# Patient Record
Sex: Female | Born: 1990 | Race: White | Hispanic: No | Marital: Single | Smoking: Never smoker
Health system: Southern US, Community
[De-identification: ages and names within clinical notes are randomized; demographics above are authoritative.]

## PROBLEM LIST (undated history)

## (undated) DIAGNOSIS — M797 Fibromyalgia: Secondary | ICD-10-CM

## (undated) DIAGNOSIS — E079 Disorder of thyroid, unspecified: Secondary | ICD-10-CM

## (undated) DIAGNOSIS — F419 Anxiety disorder, unspecified: Secondary | ICD-10-CM

## (undated) DIAGNOSIS — F32A Depression, unspecified: Secondary | ICD-10-CM

## (undated) HISTORY — DX: Disorder of thyroid, unspecified: E07.9

## (undated) HISTORY — PX: TRIGGER FINGER RELEASE: SHX641

## (undated) HISTORY — DX: Depression, unspecified: F32.A

## (undated) HISTORY — DX: Fibromyalgia: M79.7

## (undated) HISTORY — DX: Anxiety disorder, unspecified: F41.9

---

## 2015-06-05 DIAGNOSIS — M778 Other enthesopathies, not elsewhere classified: Secondary | ICD-10-CM | POA: Insufficient documentation

## 2020-01-25 ENCOUNTER — Ambulatory Visit (INDEPENDENT_AMBULATORY_CARE_PROVIDER_SITE_OTHER): Payer: 59

## 2020-01-25 ENCOUNTER — Ambulatory Visit (HOSPITAL_COMMUNITY)
Admission: EM | Admit: 2020-01-25 | Discharge: 2020-01-25 | Disposition: A | Discharge status: Home / Self Care | Payer: 59

## 2020-01-25 ENCOUNTER — Encounter (HOSPITAL_COMMUNITY): Payer: Self-pay | Admitting: Emergency Medicine

## 2020-01-25 ENCOUNTER — Other Ambulatory Visit: Payer: Self-pay

## 2020-01-25 DIAGNOSIS — M79671 Pain in right foot: Secondary | ICD-10-CM

## 2020-01-25 DIAGNOSIS — M722 Plantar fascial fibromatosis: Secondary | ICD-10-CM

## 2020-01-25 MED ORDER — PREDNISONE 20 MG PO TABS: ORAL_TABLET | ORAL | 0 refills | Status: AC

## 2020-01-25 NOTE — Discharge Instructions (Addendum)
[facility] ([place]) Podiatrist in [place], [place] COVID-19 info: [email or web address] Get online care: [email or web address] Address: [address], [place], [place] [address] Phone: [phone] Appointments: [email or web address]   [facility], [place], [place] [facility] in [place], [place] Address: [address], [place], [place] [address] Phone: [phone]  

## 2020-01-25 NOTE — ED Provider Notes (Signed)
[facility]   MRN: [id number] DOB: 10-09-1990  Subjective:   Danielle Skinner is a 30 y.o. female presenting for 1 month history of recurrent right foot pain, heel pain. Uses Mobic every day as this is been a long-term problem for her.  Has also used Tylenol.  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  Denies any falls, trauma.  No current facility-administered medications for this encounter.  Current Outpatient Medications:  .  levothyroxine (SYNTHROID) 88 MCG tablet, Take 1 tablet by mouth daily., Disp: , Rfl:  .  ADDERALL XR 30 MG 24 hr capsule, Take 30 mg by mouth every morning., Disp: , Rfl:  .  amLODipine (NORVASC) 5 MG tablet, Take by mouth., Disp: , Rfl:  .  buPROPion (WELLBUTRIN) 100 MG tablet, Take 100 mg by mouth 2 (two) times daily., Disp: , Rfl:  .  DULoxetine (CYMBALTA) 20 MG capsule, Take 20 mg by mouth 2 (two) times daily., Disp: , Rfl:  .  hydrochlorothiazide (HYDRODIURIL) 12.5 MG tablet, Take 12.5 mg by mouth daily., Disp: , Rfl:  .  melatonin 1 MG TABS tablet, Take 3 mg by mouth at bedtime., Disp: , Rfl:  .  meloxicam (MOBIC) 7.5 MG tablet, Take 7.5 mg by mouth daily., Disp: , Rfl:  .  Misc Natural Products (CORTISOL MANAGER PO), Take by mouth., Disp: , Rfl:  .  predniSONE (DELTASONE) 20 MG tablet, Take 2 tablets daily with breakfast., Disp: 10 tablet, Rfl: 0 .  spironolactone (ALDACTONE) 50 MG tablet, Take 50 mg by mouth daily., Disp: , Rfl:  .  traZODone (DESYREL) 100 MG tablet, Take by mouth., Disp: , Rfl:  .  TRI-LEGEST FE 1-20/1-30/1-35 MG-MCG tablet, Take 1 tablet by mouth daily., Disp: , Rfl:  .  valACYclovir (VALTREX) 500 MG tablet, Take 500 mg by mouth daily., Disp: , Rfl:  .  valsartan (DIOVAN) 160 MG tablet, Take by mouth., Disp: , Rfl:  .  ZINC PICOLINATE PO, Take by mouth., Disp: , Rfl:    No Known Allergies  Past Medical History:  Diagnosis Date  . Anxiety   . Depression   . Fibromyalgia    . Thyroid disease      Past Surgical History:  Procedure Laterality Date  . TRIGGER FINGER RELEASE      Family History  Problem Relation Age of Onset  . Hypertension Mother   . Bipolar disorder Father        ROS   Objective:   Vitals: BP 125/77 (BP Location: Left Arm)   Pulse 89   Temp 98.5 F (36.9 C) (Oral)   LMP 01/18/2020   SpO2 96%   Physical Exam Constitutional:      General: She is not in acute distress.    Appearance: Normal appearance. She is well-developed. She is not ill-appearing, toxic-appearing or diaphoretic.  HENT:     Head: Normocephalic and atraumatic.     Nose: Nose normal.     Mouth/Throat:     Mouth: Mucous membranes are moist.     Pharynx: Oropharynx is clear.  Eyes:     General: No scleral icterus.       Right eye: No discharge.        Left eye: No discharge.     Extraocular Movements: Extraocular movements intact.     Conjunctiva/sclera: Conjunctivae normal.     Pupils: Pupils are equal, round, and reactive to light.  Cardiovascular:     Rate and Rhythm: Normal rate.  Pulmonary:     Effort: Pulmonary effort is normal.  Musculoskeletal:     Right foot: Decreased range of motion. Normal capillary refill. Tenderness (directly over plantar surface of heel worse with toes in dorsiflexion) present. No swelling, deformity or crepitus.  Skin:    General: Skin is warm and dry.  Neurological:     General: No focal deficit present.     Mental Status: She is alert and oriented to person, place, and time.  Psychiatric:        Mood and Affect: Mood normal.        Behavior: Behavior normal.        Thought Content: Thought content normal.        Judgment: Judgment normal.     DG Foot Complete Right  Result Date: 01/25/2020 CLINICAL DATA:  Right heel pain.  Plantar fasciitis EXAM: RIGHT FOOT COMPLETE - 3+ VIEW COMPARISON:  None. FINDINGS: There is no evidence of fracture or dislocation. Small plantar calcaneal spur. There is focal  osteopenia of the spur which could reflect reactive hyperemia in the setting of plantar fasciitis. There is no evidence of arthropathy or other focal bone abnormality. Soft tissues are unremarkable. IMPRESSION: 1. Small plantar calcaneal spur. Focal osteopenia of the spur could reflect reactive hyperemia in the setting of plantar fasciitis. 2. No acute fracture or significant arthropathy of the right foot. Electronically Signed   By: Duanne Guess D.O.   On: 01/25/2020 08:59    Assessment and Plan :   PDMP not reviewed this encounter.  1. Plantar fasciitis of right foot   2. Foot pain, right     Discussed general management of plantar fasciitis.  Recommended oral prednisone course that she is not responding to meloxicam and Tylenol.  Follow-up with podiatry. Counseled patient on potential for adverse effects with medications prescribed/recommended today, ER and return-to-clinic precautions discussed, patient verbalized understanding.    Wallis Bamberg, [place] 01/25/20 [phone]

## 2020-01-25 NOTE — ED Triage Notes (Signed)
Pt c/o right foot plantar fasciitis onset about a month ago. Pt states this is a chronic problem. Pt states she takes mobic 15 mg daily and 1000 mg of tylenol when working.

## 2020-02-25 ENCOUNTER — Other Ambulatory Visit: Payer: 59

## 2020-02-25 ENCOUNTER — Ambulatory Visit (INDEPENDENT_AMBULATORY_CARE_PROVIDER_SITE_OTHER): Payer: 59 | Admitting: Podiatry

## 2020-02-25 ENCOUNTER — Ambulatory Visit (INDEPENDENT_AMBULATORY_CARE_PROVIDER_SITE_OTHER): Payer: 59

## 2020-02-25 ENCOUNTER — Other Ambulatory Visit: Payer: Self-pay

## 2020-02-25 DIAGNOSIS — M722 Plantar fascial fibromatosis: Secondary | ICD-10-CM

## 2020-02-25 MED ORDER — METHYLPREDNISOLONE 4 MG PO TBPK: ORAL_TABLET | ORAL | 0 refills | Status: AC

## 2020-02-25 NOTE — Progress Notes (Signed)
   Subjective: 30 y.o. female presenting today for evaluation of pain and tenderness to the bilateral heels has been going on for approximately 1-2 years now. 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 She has tried OTC arch supports and good supportive shoes with minimal relief. Christmas Eve she went to the urgent care and was prescribed a Medrol Dosepak for this foot pain. She presents for further treatment and evaluation   Past Medical History:  Diagnosis Date  . Anxiety   . Depression   . Fibromyalgia   . Thyroid disease      Objective: Physical Exam General: The patient is alert and oriented x3 in no acute distress.  Dermatology: Skin is warm, dry and supple bilateral lower extremities. Negative for open lesions or macerations bilateral.   Vascular: Dorsalis Pedis and Posterior Tibial pulses palpable bilateral.  Capillary fill time is immediate to all digits.  Neurological: Epicritic and protective threshold intact bilateral.   Musculoskeletal: Tenderness to palpation to the plantar aspect of the bilateral heels along the plantar fascia. All other joints range of motion within normal limits bilateral. Strength 5/5 in all groups bilateral.   Radiographic exam: Normal osseous mineralization. Joint spaces preserved. No fracture/dislocation/boney destruction. No other soft tissue abnormalities or radiopaque foreign bodies. Plantar heel spurs noted on lateral view bilateral  Assessment: 1. plantar fasciitis bilateral feet  Plan of Care:  1. Patient evaluated. Xrays reviewed.   2. Patient declined injections today. Patient states that the last set of injections in her left hand she had a flare reaction for 24 hours and it was very painful. She would like to have steroidal injections for the plantar fascia when she does not have to work 3. Return to clinic in 1 week when the patient has some time off of work to rest her feet after the injections For. Prescription for  Medrol Dosepak. After completion of the Dosepak resume meloxicam 15 mg daily 5. 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 Felecia Shelling, DPM    [address] [place], [place] [address]                Office [phone]  Fax [phone]

## 2020-03-03 ENCOUNTER — Ambulatory Visit (INDEPENDENT_AMBULATORY_CARE_PROVIDER_SITE_OTHER): Payer: 59 | Admitting: Podiatry

## 2020-03-03 ENCOUNTER — Other Ambulatory Visit: Payer: Self-pay

## 2020-03-03 DIAGNOSIS — M722 Plantar fascial fibromatosis: Secondary | ICD-10-CM | POA: Diagnosis not present

## 2020-03-03 MED ORDER — BETAMETHASONE SOD PHOS & ACET 6 (3-3) MG/ML IJ SUSP
3.0000 mg | Freq: Once | INTRAMUSCULAR | Status: AC
  Administered 2020-03-03: 3 mg via INTRA_ARTICULAR

## 2020-03-03 NOTE — Progress Notes (Signed)
   Subjective: 30 y.o. female presenting today for follow-up evaluation of plantar fasciitis bilateral that has been going on for approximately 1-2 years now. 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 She has tried OTC arch supports and good supportive shoes with minimal relief. Christmas Eve she went to the urgent care and was prescribed a Medrol Dosepak for this foot pain.   Past Medical History:  Diagnosis Date  . Anxiety   . Depression   . Fibromyalgia   . Thyroid disease      Objective: Physical Exam General: The patient is alert and oriented x3 in no acute distress.  Dermatology: Skin is warm, dry and supple bilateral lower extremities. Negative for open lesions or macerations bilateral.   Vascular: Dorsalis Pedis and Posterior Tibial pulses palpable bilateral.  Capillary fill time is immediate to all digits.  Neurological: Epicritic and protective threshold intact bilateral.   Musculoskeletal: Tenderness to palpation to the plantar aspect of the bilateral heels along the plantar fascia. All other joints range of motion within normal limits bilateral. Strength 5/5 in all groups bilateral.   Radiographic exam: Normal osseous mineralization. Joint spaces preserved. No fracture/dislocation/boney destruction. No other soft tissue abnormalities or radiopaque foreign bodies. Plantar heel spurs noted on lateral view bilateral  Assessment: 1. plantar fasciitis bilateral feet. RT > LT  Plan of Care:  1. Patient evaluated. Xrays reviewed.   2.  Injection of 0.5 cc Celestone Soluspan injected into the plantar fascia right foot 3.  Continue meloxicam daily 4.  Continue [employer]ics shoes and insoles purchased at [employer] running store 5. 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 Felecia Shelling, DPM    [address] [place], [place] [address]                Office [phone]  Fax [phone]

## 2020-03-31 ENCOUNTER — Ambulatory Visit (INDEPENDENT_AMBULATORY_CARE_PROVIDER_SITE_OTHER): Payer: 59 | Admitting: Podiatry

## 2020-03-31 ENCOUNTER — Other Ambulatory Visit: Payer: Self-pay

## 2020-03-31 DIAGNOSIS — M722 Plantar fascial fibromatosis: Secondary | ICD-10-CM

## 2020-03-31 MED ORDER — BETAMETHASONE SOD PHOS & ACET 6 (3-3) MG/ML IJ SUSP
3.0000 mg | Freq: Once | INTRAMUSCULAR | Status: AC
  Administered 2020-03-31: 3 mg via INTRA_ARTICULAR

## 2020-03-31 NOTE — Progress Notes (Signed)
   Subjective: 30 y.o. female presenting today for follow-up evaluation of plantar fasciitis bilateral that has been going on for approximately 1-2 years now. 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  Overall the patient states that there has been some improvement with the injection she received last visit.  No new complaints at this time  Past Medical History:  Diagnosis Date  . Anxiety   . Depression   . Fibromyalgia   . Thyroid disease      Objective: Physical Exam General: The patient is alert and oriented x3 in no acute distress.  Dermatology: Skin is warm, dry and supple bilateral lower extremities. Negative for open lesions or macerations bilateral.   Vascular: Dorsalis Pedis and Posterior Tibial pulses palpable bilateral.  Capillary fill time is immediate to all digits.  Neurological: Epicritic and protective threshold intact bilateral.   Musculoskeletal: Tenderness to palpation to the plantar aspect of the bilateral heels along the plantar fascia. All other joints range of motion within normal limits bilateral. Strength 5/5 in all groups bilateral.   Radiographic exam: Normal osseous mineralization. Joint spaces preserved. No fracture/dislocation/boney destruction. No other soft tissue abnormalities or radiopaque foreign bodies. Plantar heel spurs noted on lateral view bilateral  Assessment: 1. plantar fasciitis bilateral feet. RT > LT  Plan of Care:  1. Patient evaluated. Xrays reviewed.   2.  Injection of 0.5 cc Celestone Soluspan injected into the plantar fascia right foot 3.  Continue meloxicam daily 4.  Continue Asics shoes and insoles purchased at [employer] running store 5. RTC 4 weeks.  We will pursue a series of steroid injections to see if this can help alleviate her symptoms prior to trying different modalities including EPAT.  EPAT was discussed today.  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 Felecia Shelling, DPM    [address] [place], [place] [address]                Office [phone]  Fax [phone]

## 2020-04-28 ENCOUNTER — Ambulatory Visit (INDEPENDENT_AMBULATORY_CARE_PROVIDER_SITE_OTHER): Payer: 59 | Admitting: Podiatry

## 2020-04-28 ENCOUNTER — Other Ambulatory Visit: Payer: Self-pay

## 2020-04-28 DIAGNOSIS — M722 Plantar fascial fibromatosis: Secondary | ICD-10-CM

## 2020-05-01 DIAGNOSIS — M722 Plantar fascial fibromatosis: Secondary | ICD-10-CM | POA: Diagnosis not present

## 2020-05-01 MED ORDER — BETAMETHASONE SOD PHOS & ACET 6 (3-3) MG/ML IJ SUSP
3.0000 mg | Freq: Once | INTRAMUSCULAR | Status: AC
  Administered 2020-05-01: 3 mg via INTRA_ARTICULAR

## 2020-05-01 NOTE — Progress Notes (Signed)
   Subjective: 30 y.o. female presenting today for follow-up evaluation of plantar fasciitis bilateral that has been going on for approximately 1-2 years now. 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  Patient TGC to improvement with the steroid injections.  She is also been wearing good supportive shoes and takes meloxicam daily.  No new complaints at this time  Past Medical History:  Diagnosis Date  . Anxiety   . Depression   . Fibromyalgia   . Thyroid disease      Objective: Physical Exam General: The patient is alert and oriented x3 in no acute distress.  Dermatology: Skin is warm, dry and supple bilateral lower extremities. Negative for open lesions or macerations bilateral.   Vascular: Dorsalis Pedis and Posterior Tibial pulses palpable bilateral.  Capillary fill time is immediate to all digits.  Neurological: Epicritic and protective threshold intact bilateral.   Musculoskeletal: Tenderness to palpation to the plantar aspect of the bilateral heels along the plantar fascia. All other joints range of motion within normal limits bilateral. Strength 5/5 in all groups bilateral.   Radiographic exam: Normal osseous mineralization. Joint spaces preserved. No fracture/dislocation/boney destruction. No other soft tissue abnormalities or radiopaque foreign bodies. Plantar heel spurs noted on lateral view bilateral  Assessment: 1. plantar fasciitis bilateral feet. RT > LT  Plan of Care:  1. Patient evaluated. Xrays reviewed.   2.  Injection of 0.5 cc Celestone Soluspan injected into the plantar fascia right foot 3.  Continue meloxicam daily 4.  Continue Asics shoes and insoles purchased at [employer] running store 5.  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 Felecia Shelling, DPM    [address] [place], [place] [address]                Office  [phone]  Fax [phone]

## 2020-06-13 ENCOUNTER — Other Ambulatory Visit: Payer: Self-pay | Admitting: *Deleted

## 2020-06-13 DIAGNOSIS — E038 Other specified hypothyroidism: Secondary | ICD-10-CM

## 2020-08-15 ENCOUNTER — Ambulatory Visit
Admission: RE | Admit: 2020-08-15 | Discharge: 2020-08-15 | Disposition: A | Discharge status: Home / Self Care | Payer: 59 | Source: Ambulatory Visit | Attending: *Deleted | Admitting: *Deleted

## 2020-08-15 ENCOUNTER — Other Ambulatory Visit: Payer: Self-pay | Admitting: *Deleted

## 2020-08-15 ENCOUNTER — Other Ambulatory Visit: Payer: Self-pay

## 2020-08-15 DIAGNOSIS — M25561 Pain in right knee: Secondary | ICD-10-CM

## 2020-08-15 DIAGNOSIS — M25551 Pain in right hip: Secondary | ICD-10-CM

## 2020-08-15 DIAGNOSIS — M25552 Pain in left hip: Secondary | ICD-10-CM

## 2020-08-15 DIAGNOSIS — E038 Other specified hypothyroidism: Secondary | ICD-10-CM

## 2021-12-23 IMAGING — DX DG FOOT COMPLETE 3+V*R*
3 series · 3 of 3 positions shown · non-contrast
Comparison: None.

CLINICAL DATA: Right heel pain.  Plantar fasciitis

EXAM:
RIGHT FOOT COMPLETE - 3+ VIEW

[foot ap]
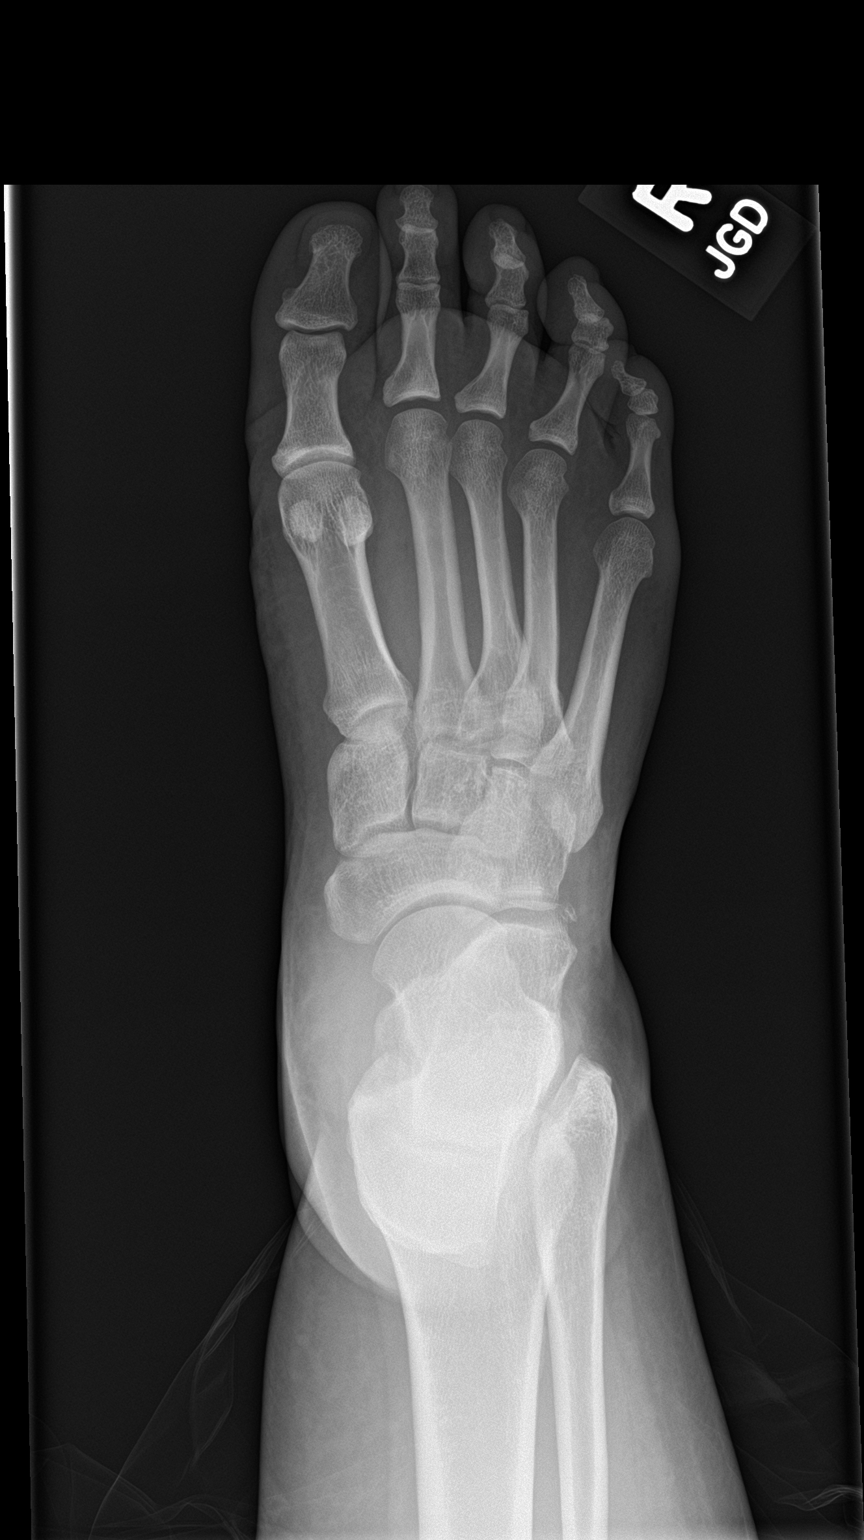

[foot obl]
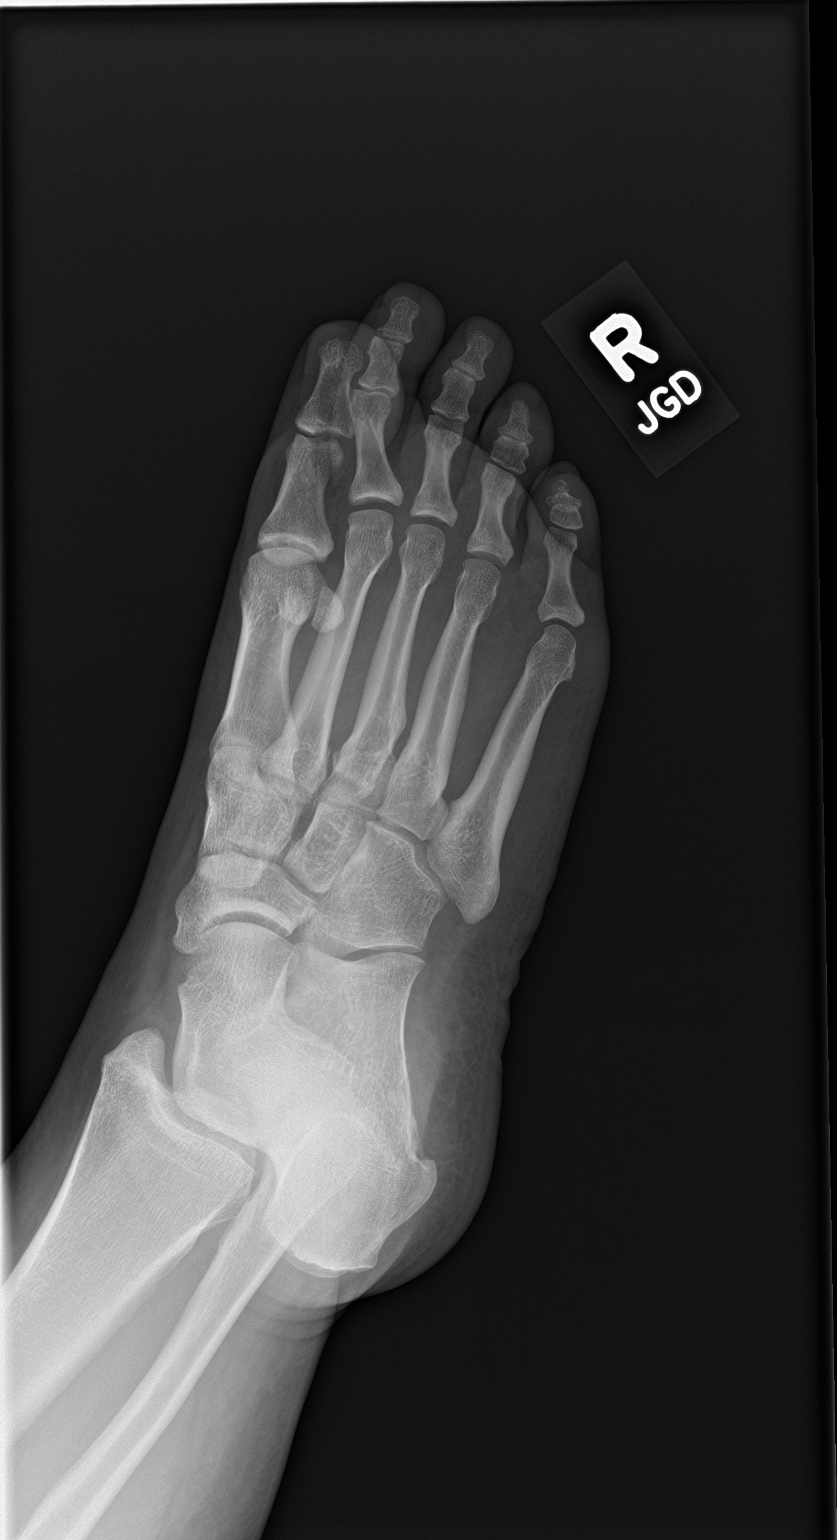

[foot lat]
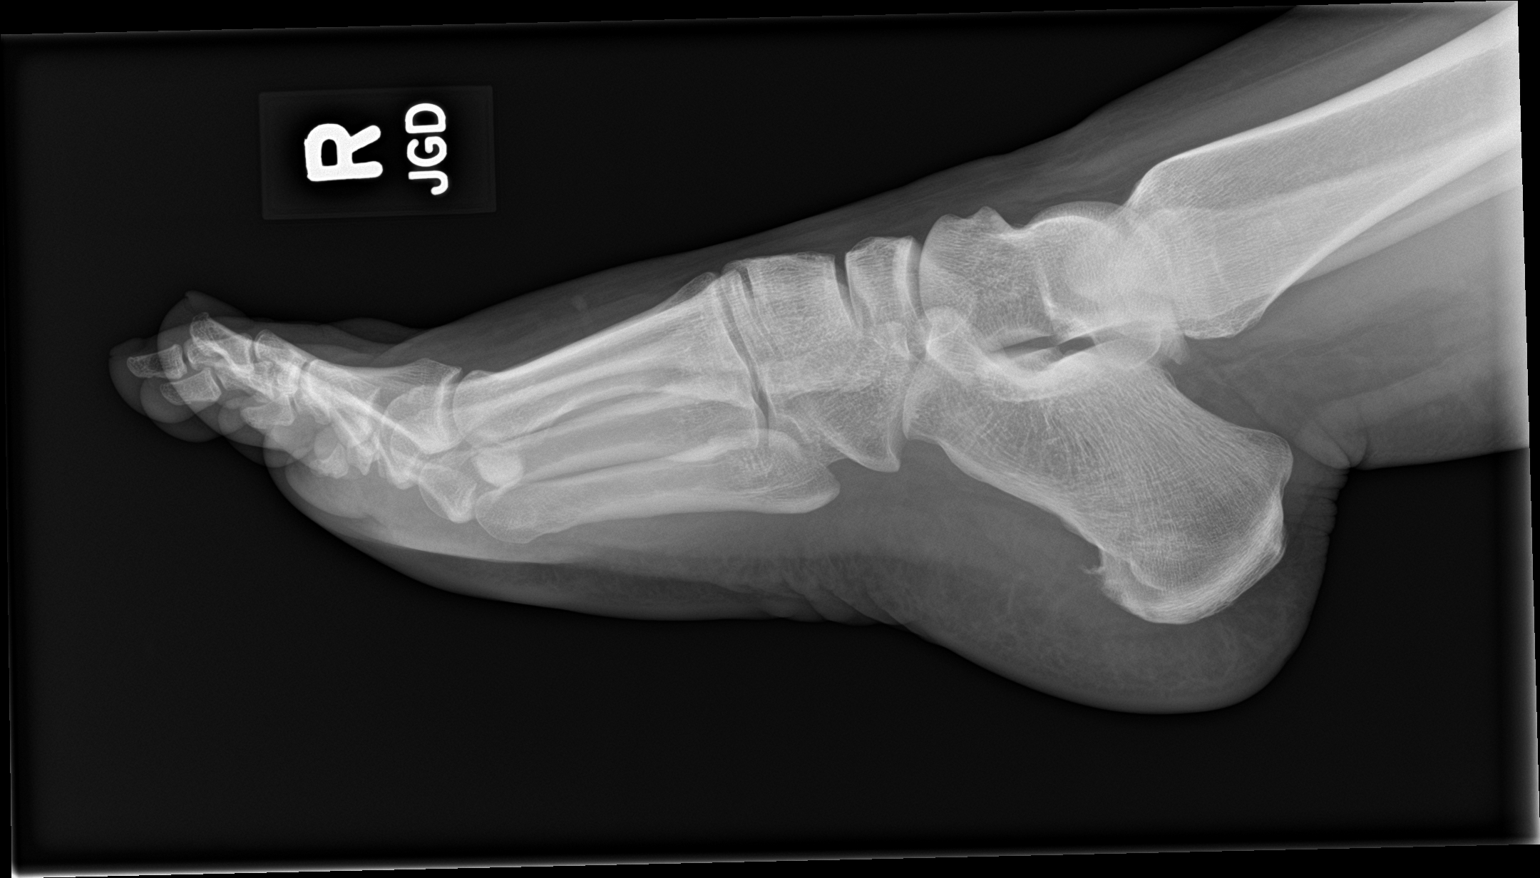

[3 of 3 positions shown; findings below may reference images not displayed]

FINDINGS: There is no evidence of fracture or dislocation. Small plantar
calcaneal spur. There is focal osteopenia of the spur which could
reflect reactive hyperemia in the setting of plantar fasciitis.
There is no evidence of arthropathy or other focal bone abnormality.
Soft tissues are unremarkable.
IMPRESSION: 1. Small plantar calcaneal spur. Focal osteopenia of the spur could
reflect reactive hyperemia in the setting of plantar fasciitis.
2. No acute fracture or significant arthropathy of the right foot.

## 2022-07-14 IMAGING — DX DG KNEE COMPLETE 4+V*R*
4 series · 4 of 4 positions shown · non-contrast
Comparison: None.

CLINICAL DATA: Chronic right knee pain

EXAM:
RIGHT KNEE - COMPLETE 4+ VIEW

[dg knee complete 4 views right (1 of 4)]
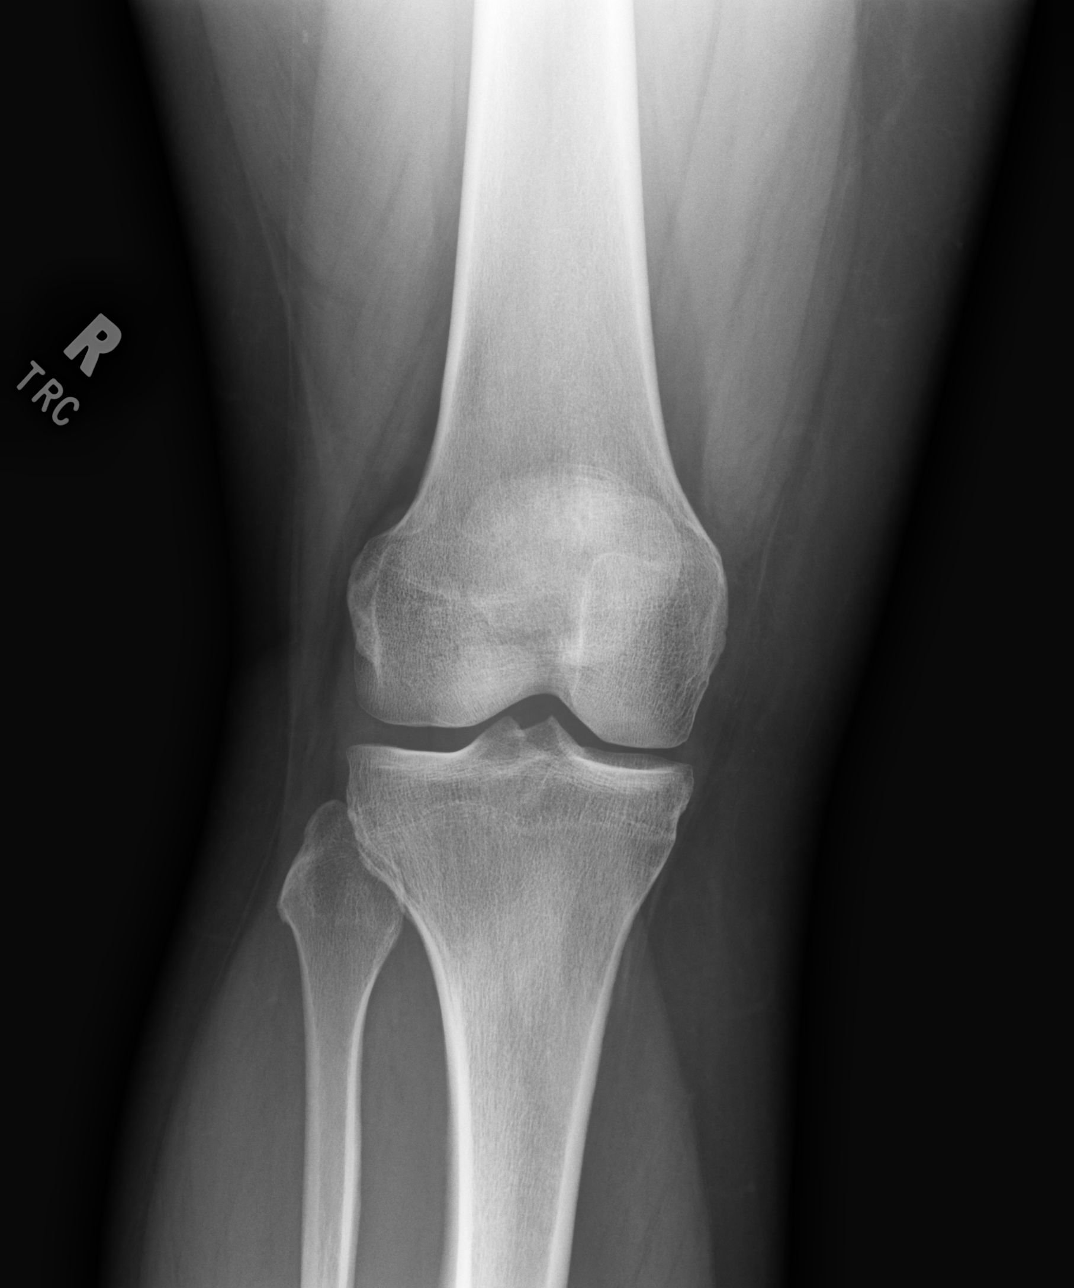

[dg knee complete 4 views right (2 of 4)]
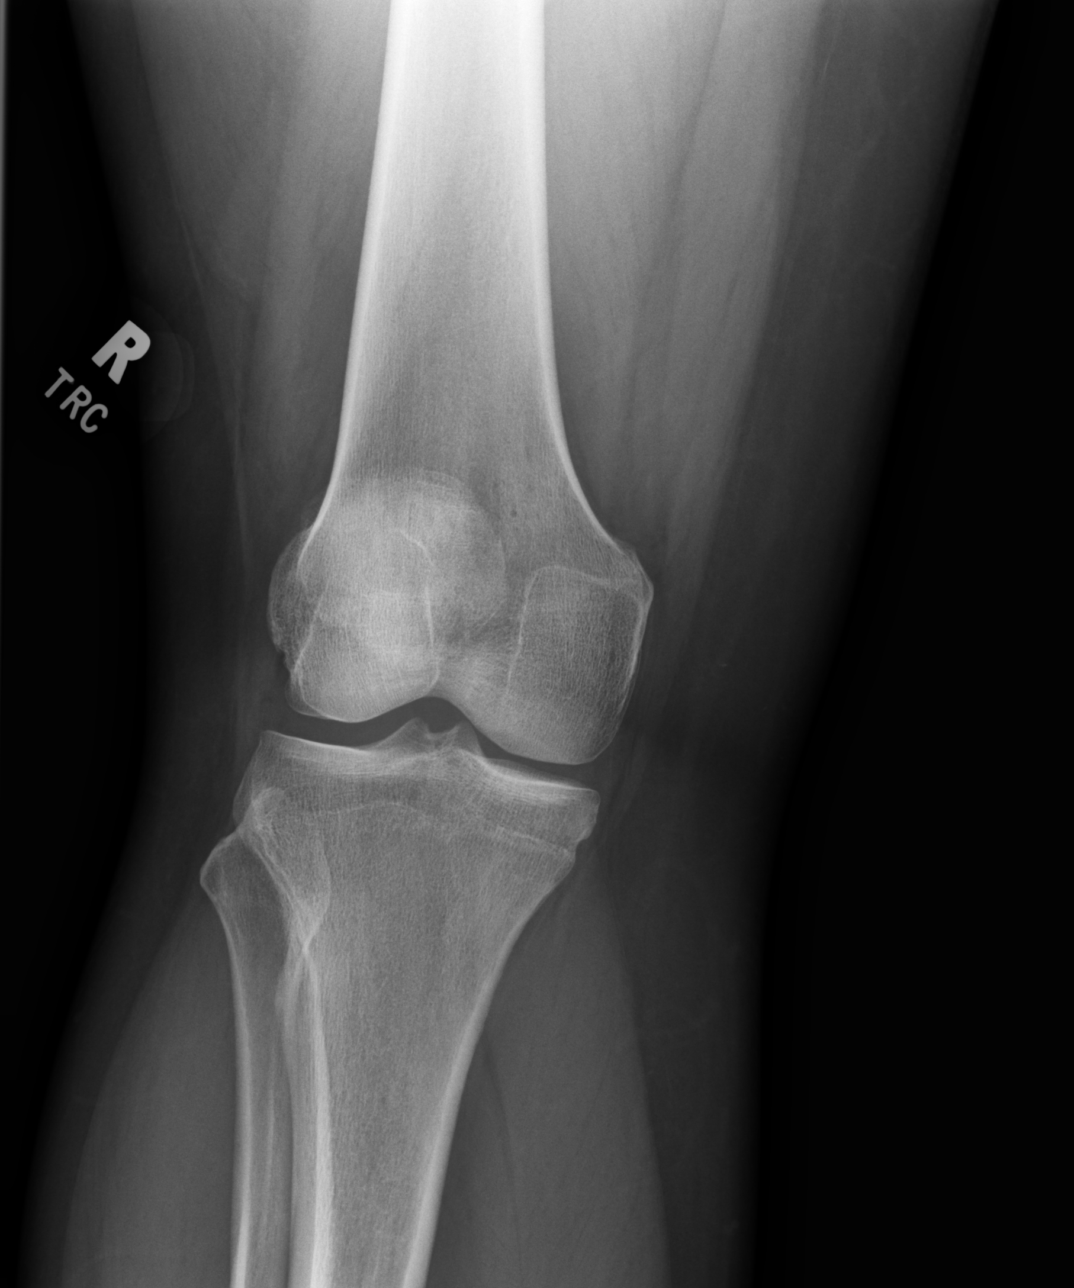

[dg knee complete 4 views right (3 of 4)]
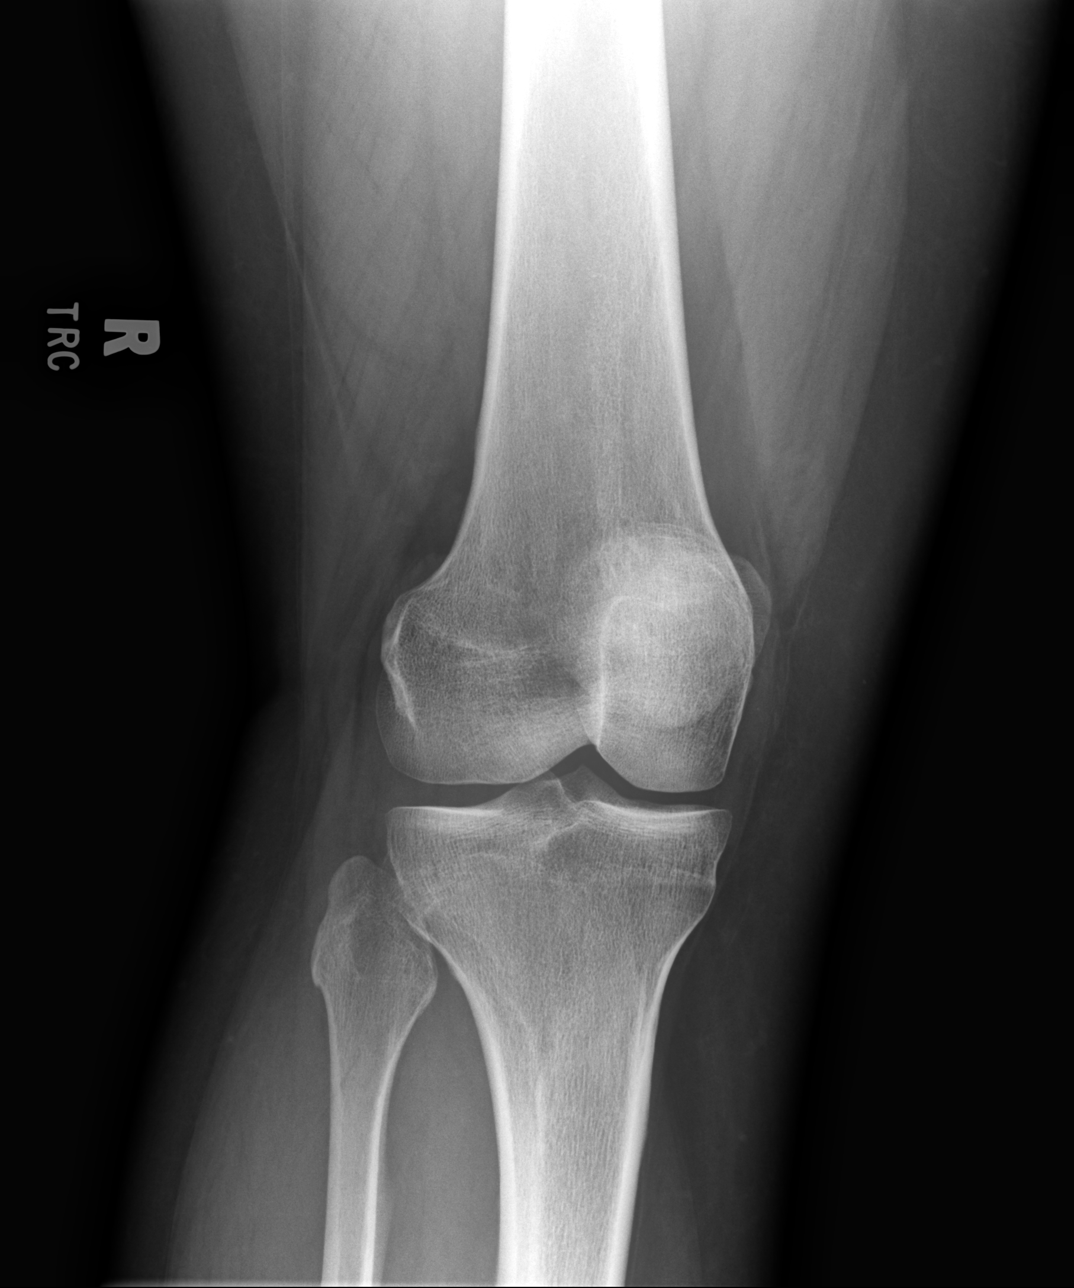

[dg knee complete 4 views right (4 of 4)]
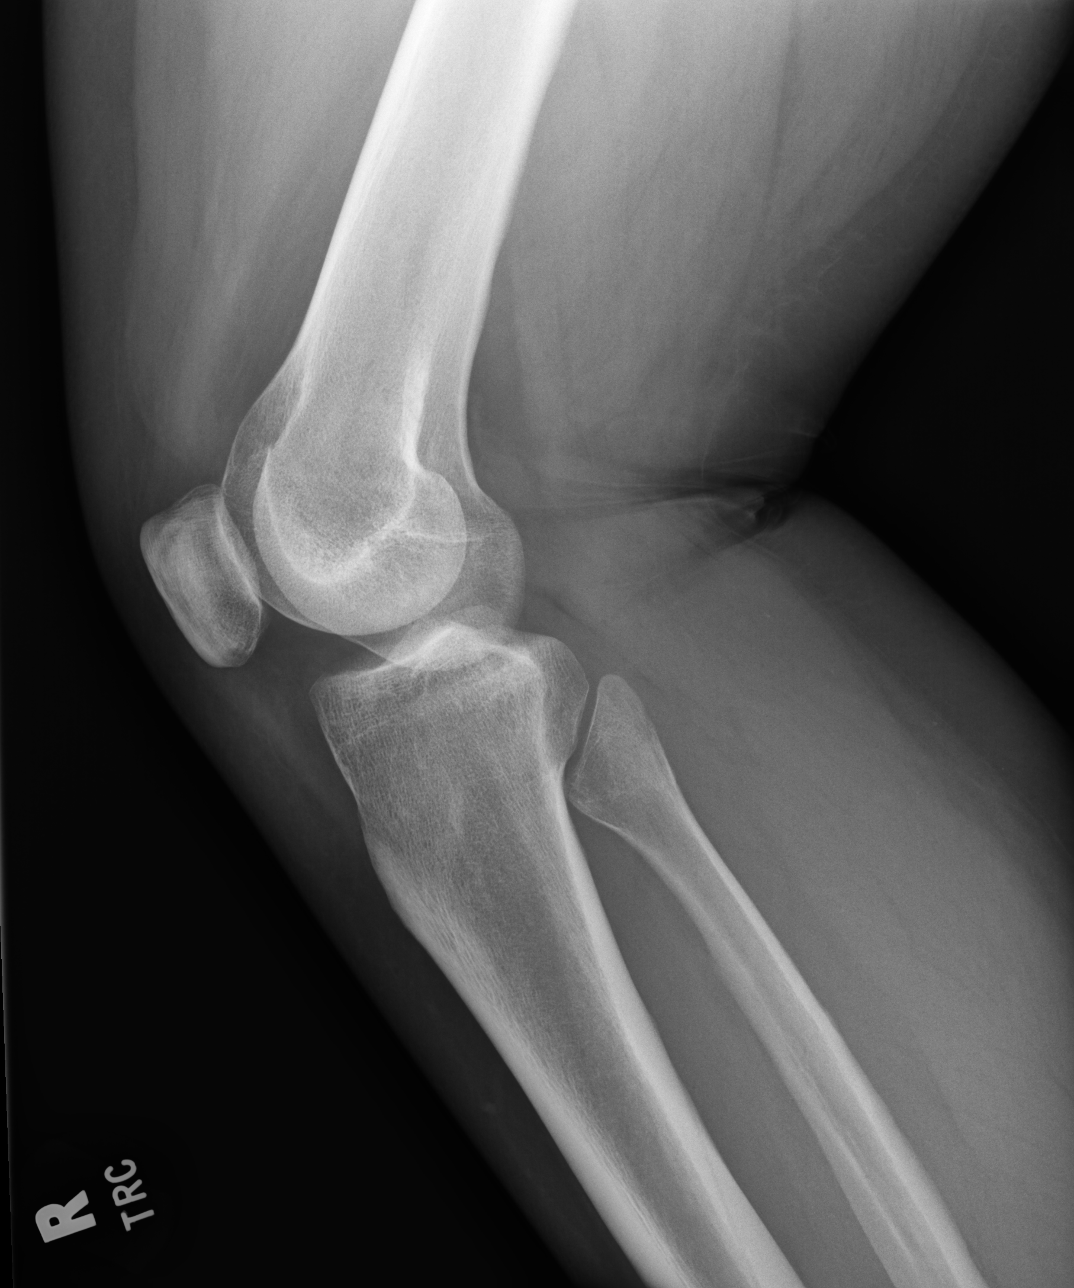

[4 of 4 positions shown; findings below may reference images not displayed]

FINDINGS: No evidence of fracture, dislocation, or joint effusion. No evidence
of arthropathy or other focal bone abnormality. Soft tissues are
unremarkable.
IMPRESSION: Negative.
